# Patient Record
Sex: Male | Born: 1990 | Race: White | Hispanic: No | Marital: Single | Smoking: Former smoker
Health system: Southern US, Community
[De-identification: ages and names within clinical notes are randomized; demographics above are authoritative.]

---
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
## 2012-05-01 ENCOUNTER — Emergency Department: Payer: Self-pay | Admitting: Emergency Medicine

## 2012-05-01 LAB — COMPREHENSIVE METABOLIC PANEL
Alkaline Phosphatase: 71 U/L (ref 50–136)
Anion Gap: 12 (ref 7–16)
BUN: 14 mg/dL (ref 7–18)
Bilirubin,Total: 1.2 mg/dL — ABNORMAL HIGH (ref 0.2–1.0)
Chloride: 109 mmol/L — ABNORMAL HIGH (ref 98–107)
Co2: 22 mmol/L (ref 21–32)
Creatinine: 0.7 mg/dL (ref 0.60–1.30)
EGFR (African American): >60
EGFR (Non-African Amer.): >60
Glucose: 117 mg/dL — ABNORMAL HIGH (ref 65–99)
Potassium: 3.9 mmol/L (ref 3.5–5.1)
SGOT(AST): 25 U/L (ref 15–37)
SGPT (ALT): 26 U/L
Total Protein: 6.5 g/dL (ref 6.4–8.2)

## 2012-05-01 LAB — URINALYSIS, COMPLETE
Blood: NEGATIVE
Glucose,UR: NEGATIVE mg/dL (ref 0–75)
Specific Gravity: 1.017 (ref 1.003–1.030)
Squamous Epithelial: NONE SEEN

## 2012-05-01 LAB — DRUG SCREEN, URINE
Benzodiazepine, Ur Scrn: POSITIVE (ref ?–200)
Cannabinoid 50 Ng, Ur ~~LOC~~: POSITIVE (ref ?–50)
Cocaine Metabolite,Ur ~~LOC~~: NEGATIVE (ref ?–300)
MDMA (Ecstasy)Ur Screen: NEGATIVE (ref ?–500)
Methadone, Ur Screen: NEGATIVE (ref ?–300)
Phencyclidine (PCP) Ur S: NEGATIVE (ref ?–25)
Tricyclic, Ur Screen: NEGATIVE (ref ?–1000)

## 2012-05-01 LAB — CBC
HCT: 51.6 % (ref 40.0–52.0)
HGB: 17.6 g/dL (ref 13.0–18.0)
MCH: 34.1 pg — ABNORMAL HIGH (ref 26.0–34.0)
MCHC: 34.2 g/dL (ref 32.0–36.0)
Platelet: 207 10*3/uL (ref 150–440)
RDW: 13.4 % (ref 11.5–14.5)

## 2013-05-30 ENCOUNTER — Emergency Department: Payer: Self-pay | Admitting: Emergency Medicine

## 2013-05-30 LAB — URINALYSIS, COMPLETE
Bacteria: NONE SEEN
Blood: NEGATIVE
Glucose,UR: NEGATIVE mg/dL (ref 0–75)
Ketone: NEGATIVE
Nitrite: NEGATIVE
Protein: NEGATIVE
RBC,UR: 1 /HPF (ref 0–5)
Specific Gravity: 1.017 (ref 1.003–1.030)

## 2013-05-30 LAB — CBC WITH DIFFERENTIAL/PLATELET
Basophil #: 0 10*3/uL (ref 0.0–0.1)
HCT: 45.8 % (ref 40.0–52.0)
MCH: 32.8 pg (ref 26.0–34.0)
MCV: 96 fL (ref 80–100)
Monocyte #: 0.4 x10 3/mm (ref 0.2–1.0)
Monocyte %: 7 %
Neutrophil #: 3.6 10*3/uL (ref 1.4–6.5)
Platelet: 186 10*3/uL (ref 150–440)
WBC: 5.8 10*3/uL (ref 3.8–10.6)

## 2013-05-30 LAB — COMPREHENSIVE METABOLIC PANEL
Anion Gap: 5 — ABNORMAL LOW (ref 7–16)
BUN: 16 mg/dL (ref 7–18)
Chloride: 104 mmol/L (ref 98–107)
Creatinine: 0.95 mg/dL (ref 0.60–1.30)
EGFR (African American): >60
EGFR (Non-African Amer.): >60
Glucose: 91 mg/dL (ref 65–99)
Osmolality: 278 (ref 275–301)
Potassium: 3.9 mmol/L (ref 3.5–5.1)
SGOT(AST): 20 U/L (ref 15–37)
SGPT (ALT): 25 U/L (ref 12–78)
Sodium: 139 mmol/L (ref 136–145)

## 2013-05-30 LAB — DRUG SCREEN, URINE
Amphetamines, Ur Screen: POSITIVE (ref ?–1000)
Barbiturates, Ur Screen: NEGATIVE (ref ?–200)
Benzodiazepine, Ur Scrn: NEGATIVE (ref ?–200)
MDMA (Ecstasy)Ur Screen: NEGATIVE (ref ?–500)
Methadone, Ur Screen: POSITIVE (ref ?–300)

## 2017-07-27 ENCOUNTER — Encounter: Payer: Self-pay | Admitting: Emergency Medicine

## 2017-07-27 ENCOUNTER — Emergency Department
Admission: EM | Admit: 2017-07-27 | Discharge: 2017-07-27 | Disposition: A | Discharge status: Home / Self Care | Payer: Self-pay | Attending: Emergency Medicine | Admitting: Emergency Medicine

## 2017-07-27 DIAGNOSIS — L03021 Acute lymphangitis of right finger: Secondary | ICD-10-CM | POA: Insufficient documentation

## 2017-07-27 DIAGNOSIS — I891 Lymphangitis: Secondary | ICD-10-CM

## 2017-07-27 DIAGNOSIS — Z87891 Personal history of nicotine dependence: Secondary | ICD-10-CM | POA: Insufficient documentation

## 2017-07-27 DIAGNOSIS — L03011 Cellulitis of right finger: Secondary | ICD-10-CM | POA: Insufficient documentation

## 2017-07-27 LAB — COMPREHENSIVE METABOLIC PANEL
ALK PHOS: 67 U/L (ref 38–126)
ALT: 36 U/L (ref 17–63)
AST: 28 U/L (ref 15–41)
Albumin: 4.3 g/dL (ref 3.5–5.0)
Anion gap: 8 (ref 5–15)
BUN: 19 mg/dL (ref 6–20)
CALCIUM: 9.1 mg/dL (ref 8.9–10.3)
CO2: 27 mmol/L (ref 22–32)
CREATININE: 0.81 mg/dL (ref 0.61–1.24)
Chloride: 101 mmol/L (ref 101–111)
GFR calc non Af Amer: >60 mL/min (ref >60)
GLUCOSE: 94 mg/dL (ref 65–99)
Potassium: 4.3 mmol/L (ref 3.5–5.1)
SODIUM: 136 mmol/L (ref 135–145)
Total Bilirubin: 0.7 mg/dL (ref 0.3–1.2)
Total Protein: 7.2 g/dL (ref 6.5–8.1)

## 2017-07-27 LAB — CBC WITH DIFFERENTIAL/PLATELET
BASOS ABS: 0 10*3/uL (ref 0–0.1)
Basophils Relative: 0 %
EOS ABS: 0.1 10*3/uL (ref 0–0.7)
Eosinophils Relative: 1 %
HCT: 44.7 % (ref 40.0–52.0)
HEMOGLOBIN: 15.4 g/dL (ref 13.0–18.0)
LYMPHS ABS: 1.2 10*3/uL (ref 1.0–3.6)
LYMPHS PCT: 14 %
MCH: 32.8 pg (ref 26.0–34.0)
MCHC: 34.4 g/dL (ref 32.0–36.0)
MCV: 95.2 fL (ref 80.0–100.0)
Monocytes Absolute: 0.7 10*3/uL (ref 0.2–1.0)
Monocytes Relative: 8 %
NEUTROS PCT: 77 %
Neutro Abs: 6.5 10*3/uL (ref 1.4–6.5)
Platelets: 166 10*3/uL (ref 150–440)
RBC: 4.69 MIL/uL (ref 4.40–5.90)
RDW: 13 % (ref 11.5–14.5)
WBC: 8.4 10*3/uL (ref 3.8–10.6)

## 2017-07-27 MED ORDER — CLINDAMYCIN HCL 300 MG PO CAPS: 300.0000 mg | ORAL_CAPSULE | Freq: Four times a day (QID) | ORAL | 0 refills | Status: AC

## 2017-07-27 MED ORDER — CLINDAMYCIN PHOSPHATE 300 MG/2ML IJ SOLN
INTRAMUSCULAR | Status: AC
  Filled 2017-07-27: qty 4

## 2017-07-27 MED ORDER — CLINDAMYCIN PHOSPHATE 600 MG/4ML IJ SOLN
600.0000 mg | Freq: Once | INTRAMUSCULAR | Status: AC
  Administered 2017-07-27: 600 mg via INTRAMUSCULAR

## 2017-07-27 NOTE — ED Triage Notes (Signed)
Burn to right thumb on Sunday.  Today noticed red streaking up arm.

## 2017-07-27 NOTE — ED Notes (Signed)
Pt had burn to right hand thumb on Sunday from oven. Blisters appeared, pt popped blister. Redness running all the way up to armpit. Throbbing pain. Pt alert and oriented X4, active, cooperative, pt in NAD. RR even and unlabored, color WNL.  2 ibuprofen taken at 7AM today.

## 2017-07-27 NOTE — Discharge Instructions (Signed)
Take medication as prescribed. Return to emergency department if symptoms worsen and follow-up with PCP as needed.   °

## 2017-07-27 NOTE — ED Provider Notes (Signed)
[facility] Emergency Department Provider Note   ____________________________________________   I have reviewed the triage vital signs and the nursing notes.   HISTORY  Chief Complaint Arm Problem    HPI Wayne Fisher is a 26 y.o. male presents to the emergency department with right thumb wound he sustained after burning his thumb on Sunday. Since the burn, he noted reddening and swelling developing localized around the thumb. Patient awoke this morning with fever, chills and sweating. He also noted redness streaking from the right thumb of to the axilla of his right upper extremity.  Patient denies headache, vision changes, chest pain, chest tightness, shortness of breath, abdominal pain, nausea and vomiting.  History reviewed. No pertinent past medical history.  There are no active problems to display for this patient.   History reviewed. No pertinent surgical history.  Prior to Admission medications   Medication Sig Start Date End Date Taking? Authorizing Provider  clindamycin (CLEOCIN) 300 MG capsule Take 1 capsule (300 mg total) by mouth 4 (four) times daily. 07/27/17 08/06/17  Little, Jordan Likes, PA-C    Allergies Patient has no known allergies.  No family history on file.  Social History Social History  Substance Use Topics  . Smoking status: Former Smoker    Types: Cigarettes  . Smokeless tobacco: Never Used  . Alcohol use Yes     Comment: occasionally    Review of Systems Constitutional: Negative for fever/chills Eyes: No visual changes. ENT:  Negative for sore throat and for difficulty swallowing Cardiovascular: Denies chest pain. Respiratory: Denies cough. Denies shortness of breath. Gastrointestinal: No abdominal pain.  No nausea, vomiting, diarrhea. Genitourinary: Negative for dysuria. Musculoskeletal: Negative for back pain. Skin: Negative for rash. Positive for right thumb pain, redness with redness streaking along the  right upper extremity to the axillary region. Neurological: Negative for headaches. ____________________________________________   PHYSICAL EXAM:  VITAL SIGNS: ED Triage Vitals  Enc Vitals Group     BP 07/27/17 1118 132/88     Pulse Rate 07/27/17 1118 68     Resp 07/27/17 1118 20     Temp 07/27/17 1118 98.3 F (36.8 C)     Temp Source 07/27/17 1118 Oral     SpO2 07/27/17 1118 99 %     Weight 07/27/17 1118 155 lb (70.3 kg)     Height 07/27/17 1118 5\' 9"  (1.753 m)     Head Circumference --      Peak Flow --      Pain Score 07/27/17 1129 7     Pain Loc --      Pain Edu? --      Excl. in GC? --     Constitutional: Alert and oriented. Well appearing and in no acute distress.  Eyes: Conjunctivae are normal. PERRL. EOMI  Head: Normocephalic and atraumatic. ENT:      Ears: Canals clear. TMs intact bilaterally.      Nose: No congestion/rhinnorhea.      Mouth/Throat: Mucous membranes are moist.  Neck:Supple. No thyromegaly. No stridor.  Cardiovascular: Normal rate, regular rhythm. Normal S1 and S2.  Good peripheral circulation. Respiratory: Normal respiratory effort without tachypnea or retractions. Lungs CTAB. No wheezes/rales/rhonchi.  Hematological/Lymphatic/Immunological: No cervical lymphadenopathy. Cardiovascular: Normal rate, regular rhythm. Normal distal pulses. Gastrointestinal: Bowel sounds 4 quadrants. Soft and nontender to palpation. Musculoskeletal: Nontender with normal range of motion in all extremities. Neurologic: Normal speech and language.  Skin:  Skin is warm, dry and intact. No rash noted.  Erythema, swelling around burns along right thumb with lymphangitis streaking to the right axilla region. Psychiatric: Mood and affect are normal. Speech and behavior are normal. Patient exhibits appropriate insight and judgement.  ____________________________________________   LABS (all labs ordered are listed, but only abnormal results are displayed)  Labs Reviewed    CBC WITH DIFFERENTIAL/PLATELET  COMPREHENSIVE METABOLIC PANEL   ____________________________________________  EKG none ____________________________________________  RADIOLOGY none ____________________________________________   PROCEDURES  Procedure(s) performed: no    Critical Care performed: no ____________________________________________   INITIAL IMPRESSION / ASSESSMENT AND PLAN / ED COURSE  Pertinent labs & imaging results that were available during my care of the patient were reviewed by me and considered in my medical decision making (see chart for details).  Patient presents to emergency department with right thumb pain, erythema and progressing redness streaking to the right axillary region.Marland Kitchen History, physical exam findings and labs are reassuring symptoms are consistent with cellulitis and lymphangitis that developed as result of right thumb burn wounds. Antibodies initiated in the emergency department, clindamycin 600 mg IM. Patient will be prescribed clindamycin for interbody coverage. Patient advised to follow up with PCP as needed or return to the emergency department if symptoms return or worsen. Patient informed of clinical course, understand medical decision-making process, and agree with plan. ____________________________________________   FINAL CLINICAL IMPRESSION(S) / ED DIAGNOSES  Final diagnoses:  Cellulitis of right thumb  Lymphangitis       NEW MEDICATIONS STARTED DURING THIS VISIT:  Discharge Medication List as of 07/27/2017  2:22 PM    START taking these medications   Details  clindamycin (CLEOCIN) 300 MG capsule Take 1 capsule (300 mg total) by mouth 4 (four) times daily., Starting Tue 07/27/2017, Until Fri 08/06/2017, Print         Note:  This document was prepared using Dragon voice recognition software and may include unintentional dictation errors.    Clois Comber, PA-C 07/27/17 1439    Emily Filbert, MD 07/27/17  [phone]

## 2018-05-13 ENCOUNTER — Emergency Department
Admission: EM | Admit: 2018-05-13 | Discharge: 2018-05-13 | Disposition: A | Discharge status: Home / Self Care | Payer: Self-pay | Attending: Emergency Medicine | Admitting: Emergency Medicine

## 2018-05-13 ENCOUNTER — Other Ambulatory Visit: Payer: Self-pay

## 2018-05-13 DIAGNOSIS — L089 Local infection of the skin and subcutaneous tissue, unspecified: Secondary | ICD-10-CM | POA: Insufficient documentation

## 2018-05-13 DIAGNOSIS — T148XXA Other injury of unspecified body region, initial encounter: Secondary | ICD-10-CM

## 2018-05-13 DIAGNOSIS — Z87891 Personal history of nicotine dependence: Secondary | ICD-10-CM | POA: Insufficient documentation

## 2018-05-13 MED ORDER — CEPHALEXIN 500 MG PO CAPS: 500.0000 mg | ORAL_CAPSULE | Freq: Two times a day (BID) | ORAL | 0 refills | Status: DC

## 2018-05-13 MED ORDER — CEPHALEXIN 500 MG PO CAPS
500.0000 mg | ORAL_CAPSULE | Freq: Once | ORAL | Status: AC
  Administered 2018-05-13: 500 mg via ORAL
  Filled 2018-05-13: qty 1

## 2018-05-13 MED ORDER — LIDOCAINE-EPINEPHRINE-TETRACAINE (LET) SOLUTION
3.0000 mL | Freq: Once | NASAL | Status: AC
  Administered 2018-05-13: 3 mL via TOPICAL
  Filled 2018-05-13: qty 3

## 2018-05-13 MED ORDER — SULFAMETHOXAZOLE-TRIMETHOPRIM 800-160 MG PO TABS
1.0000 | ORAL_TABLET | Freq: Once | ORAL | Status: AC
  Administered 2018-05-13: 1 via ORAL
  Filled 2018-05-13: qty 1

## 2018-05-13 MED ORDER — SULFAMETHOXAZOLE-TRIMETHOPRIM 800-160 MG PO TABS: 1.0000 | ORAL_TABLET | Freq: Two times a day (BID) | ORAL | 0 refills | Status: DC

## 2018-05-13 NOTE — ED Notes (Signed)
Pt states he burned his right thumb while cooking on Tuesday and now has swelling and pain to the site..Marland Kitchen

## 2018-05-13 NOTE — ED Triage Notes (Signed)
Pt states that he burned his rt thumb Monday, now states that he has a red streak moving from the thumb up to the inside of his rt elbow. Pt denies fever at home

## 2018-05-13 NOTE — ED Provider Notes (Signed)
[facility] Emergency Department Provider Note   ____________________________________________    I have reviewed the triage vital signs and the nursing notes.   HISTORY  Chief Complaint Wound Infection     HPI Wayne Fisher is a 27 y.o. male who presents with complaints of swelling and pain to his right thumb.  Reports he burned his thumb on Monday and the next day he developed swelling over the burn as well as redness.  He felt that there was some fluid there and used a needle to try to drain it.  He presents today because of a streak of redness going up his arm from his thumb.  No fevers or chills.  No history of diabetes.  Reports a similar infection in the past which required antibiotic   No past medical history on file.  There are no active problems to display for this patient.   No past surgical history on file.  Prior to Admission medications   Medication Sig Start Date End Date Taking? Authorizing Provider  cephALEXin (KEFLEX) 500 MG capsule Take 1 capsule (500 mg total) by mouth 2 (two) times daily. 05/13/18   Jene Every, MD  sulfamethoxazole-trimethoprim (BACTRIM DS,SEPTRA DS) 800-160 MG tablet Take 1 tablet by mouth 2 (two) times daily. 05/13/18   Jene Every, MD     Allergies Patient has no known allergies.  No family history on file.  Social History Social History   Tobacco Use  . Smoking status: Former Smoker    Types: Cigarettes  . Smokeless tobacco: Never Used  Substance Use Topics  . Alcohol use: Yes    Comment: occasionally  . Drug use: Yes    Types: Marijuana    Review of Systems  Constitutional: No fever/chills Eyes: No blurry vision ENT: No sore throat. Cardiovascular: Denies palpitations Respiratory: Denies cough Gastrointestinal: No nausea, no vomiting.   Genitourinary: Negative for dysuria. Musculoskeletal: As above Skin: As above Neurological: Negative for  headaches   ____________________________________________   PHYSICAL EXAM:  VITAL SIGNS: ED Triage Vitals  Enc Vitals Group     BP 05/13/18 0851 125/70     Pulse Rate 05/13/18 0851 67     Resp 05/13/18 0851 18     Temp 05/13/18 0851 98 F (36.7 C)     Temp Source 05/13/18 0851 Oral     SpO2 05/13/18 0851 98 %     Weight 05/13/18 0852 68 kg (150 lb)     Height 05/13/18 0852 1.727 m (5\' 8" )     Head Circumference --      Peak Flow --      Pain Score 05/13/18 0851 6     Pain Loc --      Pain Edu? --      Excl. in GC? --     Constitutional: Alert and oriented. No acute distress. Pleasant and interactive  Nose: No congestion/rhinnorhea. Mouth/Throat: Mucous membranes are moist.    Cardiovascular: Normal rate, regular rhythm.   Good peripheral circulation. Respiratory: Normal respiratory effort.  No retractions.    Musculoskeletal: Lateral aspect of right distal thumb approximately 2 cm x 1 cm area of erythema with minimal fluctuance and some blistering with  erythema streaking down the thumb and up the arm, warm and well perfused Neurologic:  Normal speech and language. No gross focal neurologic deficits are appreciated.  Skin:  Skin is warm, dry.  As above   ____________________________________________   LABS (all labs ordered are listed,  but only abnormal results are displayed)  Labs Reviewed - No data to display ____________________________________________  EKG   ____________________________________________  RADIOLOGY   ____________________________________________   PROCEDURES  Procedure(s) performed: yes  .Marland Kitchen.Incision and Drainage Date/Time: 05/13/2018 9:36 AM Performed by: Jene EveryKinner, Kazimir Hartnett, MD Authorized by: Jene EveryKinner, Finola Rosal, MD   Consent:    Consent obtained:  Verbal   Consent given by:  Patient   Risks discussed:  Bleeding, infection, incomplete drainage and pain   Alternatives discussed:  Alternative treatment, delayed treatment and  observation Location:    Type:  Abscess   Location:  Upper extremity   Upper extremity location:  Hand   Hand location:  R hand Pre-procedure details:    Skin preparation:  Antiseptic wash Anesthesia (see MAR for exact dosages):    Anesthesia method:  Topical application Procedure type:    Complexity:  Simple Procedure details:    Incision types:  Single straight   Incision depth:  Dermal   Scalpel blade:  11   Drainage:  Purulent   Drainage amount:  Scant   Wound treatment:  Wound left open   Packing materials:  None Post-procedure details:    Patient tolerance of procedure:  Tolerated well, no immediate complications     Critical Care performed: No ____________________________________________   INITIAL IMPRESSION / ASSESSMENT AND PLAN / ED COURSE  Pertinent labs & imaging results that were available during my care of the patient were reviewed by me and considered in my medical decision making (see chart for details).  Mild fluctuance with suspicion of some purulence trapped beneath, let applied, small incision made.  We will treat the patient with Bactrim and Keflex and close outpatient follow-up    ____________________________________________   FINAL CLINICAL IMPRESSION(S) / ED DIAGNOSES  Final diagnoses:  Wound infection        Note:  This document was prepared using Dragon voice recognition software and may include unintentional dictation errors.    Jene EveryKinner, Zael Shuman, MD 05/13/18 [phone]

## 2018-12-10 ENCOUNTER — Other Ambulatory Visit: Payer: Self-pay

## 2018-12-10 ENCOUNTER — Emergency Department
Admission: EM | Admit: 2018-12-10 | Discharge: 2018-12-11 | Disposition: A | Discharge status: Home / Self Care | Payer: Self-pay | Attending: Emergency Medicine | Admitting: Emergency Medicine

## 2018-12-10 ENCOUNTER — Emergency Department: Payer: Self-pay

## 2018-12-10 ENCOUNTER — Encounter: Payer: Self-pay | Admitting: Emergency Medicine

## 2018-12-10 DIAGNOSIS — R0602 Shortness of breath: Secondary | ICD-10-CM | POA: Insufficient documentation

## 2018-12-10 DIAGNOSIS — Z79899 Other long term (current) drug therapy: Secondary | ICD-10-CM | POA: Insufficient documentation

## 2018-12-10 DIAGNOSIS — Z87891 Personal history of nicotine dependence: Secondary | ICD-10-CM | POA: Insufficient documentation

## 2018-12-10 LAB — BASIC METABOLIC PANEL
Anion gap: 12 (ref 5–15)
BUN: 15 mg/dL (ref 6–20)
CO2: 23 mmol/L (ref 22–32)
Calcium: 9.7 mg/dL (ref 8.9–10.3)
Chloride: 104 mmol/L (ref 98–111)
Creatinine, Ser: 1.11 mg/dL (ref 0.61–1.24)
GFR calc Af Amer: >60 mL/min (ref >60)
GFR calc non Af Amer: >60 mL/min (ref >60)
Glucose, Bld: 121 mg/dL — ABNORMAL HIGH (ref 70–99)
Potassium: 3.4 mmol/L — ABNORMAL LOW (ref 3.5–5.1)
Sodium: 139 mmol/L (ref 135–145)

## 2018-12-10 LAB — CBC
HEMATOCRIT: 44.6 % (ref 39.0–52.0)
Hemoglobin: 15.7 g/dL (ref 13.0–17.0)
MCH: 32.5 pg (ref 26.0–34.0)
MCHC: 35.2 g/dL (ref 30.0–36.0)
MCV: 92.3 fL (ref 80.0–100.0)
PLATELETS: 202 10*3/uL (ref 150–400)
RBC: 4.83 MIL/uL (ref 4.22–5.81)
RDW: 11.7 % (ref 11.5–15.5)
WBC: 6 10*3/uL (ref 4.0–10.5)
nRBC: 0 % (ref 0.0–0.2)

## 2018-12-10 LAB — TROPONIN I: Troponin I: <0.03 ng/mL (ref <0.03)

## 2018-12-10 NOTE — ED Triage Notes (Addendum)
C/O SOB intermittently x 2-3 months.  While at work today had another episode and finger and hand felt stiff.  Patient states he feels anxious after episodes of SOB begin.  Also states that he has had sinus drainage, that accompanies sensation of SOB.  States throat can be sore intermittently and feeling like he is being choked.  Patient is AAOx3.  Skin warm and dry. NAD.Marland Kitchen  Voice clear and strong. Patient is very anxious in triage.

## 2018-12-11 MED ORDER — PSEUDOEPHEDRINE HCL ER 120 MG PO TB12: 120.0000 mg | ORAL_TABLET | Freq: Two times a day (BID) | ORAL | 0 refills | Status: AC

## 2018-12-11 MED ORDER — FLUTICASONE PROPIONATE 50 MCG/ACT NA SUSP: 1.0000 | Freq: Every day | NASAL | 0 refills | Status: AC

## 2018-12-11 NOTE — ED Provider Notes (Signed)
[facility] Emergency Department Provider Note ____________________________________________   First MD Initiated Contact with Patient 12/10/18 2357     (approximate)  I have reviewed the triage vital signs and the nursing notes.   HISTORY  Chief Complaint Shortness of Breath    HPI Wayne Fisher is a 28 y.o. male with no significant PMH who presents with intermittent episodes of shortness of breath over the last month, sometimes lasting a few minutes, or lasting up to an hour at times.  He states he had an episode today while he was at work and and subsequently felt stiffness in his fingers and hands bilaterally as well as some tingling.  He has never had this happen before.  He also reports that over the last few weeks he has had postnasal drip and sinus pressure with occasional tightness in his throat.  The patient does endorse anxiety although he denies any specific life stressors.  He states he is on Suboxone and is trying to lower the dose.  He states he occasionally uses marijuana but denies other illicit drug use and denies significant alcohol use.   History reviewed. No pertinent past medical history.  There are no active problems to display for this patient.   History reviewed. No pertinent surgical history.  Prior to Admission medications   Medication Sig Start Date End Date Taking? Authorizing Provider  cephALEXin (KEFLEX) 500 MG capsule Take 1 capsule (500 mg total) by mouth 2 (two) times daily. 05/13/18   Jene EveryKinner, Robert, MD  fluticasone (FLONASE) 50 MCG/ACT nasal spray Place 1 spray into both nostrils daily for 15 days. 12/11/18 12/26/18  Dionne BucySiadecki, Marianny Goris, MD  pseudoephedrine (SUDAFED) 120 MG 12 hr tablet Take 1 tablet (120 mg total) by mouth 2 (two) times daily for 15 days. 12/11/18 12/26/18  Dionne BucySiadecki, Quinetta Shilling, MD  sulfamethoxazole-trimethoprim (BACTRIM DS,SEPTRA DS) 800-160 MG tablet Take 1 tablet by mouth 2 (two) times daily. 05/13/18    Jene EveryKinner, Robert, MD    Allergies Patient has no known allergies.  No family history on file.  Social History Social History   Tobacco Use  . Smoking status: Former Smoker    Types: Cigarettes  . Smokeless tobacco: Never Used  Substance Use Topics  . Alcohol use: Yes    Comment: occasionally  . Drug use: Yes    Types: Marijuana    Review of Systems  Constitutional: No fever. Eyes: No redness. ENT: Positive for intermittent throat pain. Cardiovascular: Denies chest pain. Respiratory: Positive for intermittent shortness of breath. Gastrointestinal: No vomiting or diarrhea.  Genitourinary: Negative for flank pain.  Musculoskeletal: Negative for back pain. Skin: Negative for rash. Neurological: Negative for headache.  ____________________________________________   PHYSICAL EXAM:  VITAL SIGNS: ED Triage Vitals  Enc Vitals Group     BP 12/10/18 1657 135/76     Pulse Rate 12/10/18 1657 94     Resp 12/10/18 1657 16     Temp 12/10/18 1657 98.2 F (36.8 C)     Temp Source 12/10/18 1657 Oral     SpO2 12/10/18 1657 97 %     Weight 12/10/18 1656 155 lb (70.3 kg)     Height 12/10/18 1656 5\' 10"  (1.778 m)     Head Circumference --      Peak Flow --      Pain Score 12/10/18 1654 3     Pain Loc --      Pain Edu? --      Excl. in GC? --  Constitutional: Alert and oriented.  Anxious appearing but in no acute distress. Eyes: Conjunctivae are normal.  Head: Atraumatic. Nose: No congestion/rhinnorhea. Mouth/Throat: Mucous membranes are moist.   Neck: Normal range of motion.  Cardiovascular: Normal rate, regular rhythm. Grossly normal heart sounds.  Good peripheral circulation. Respiratory: Normal respiratory effort.  No retractions. Lungs CTAB. Gastrointestinal: No distention.  Musculoskeletal: No lower extremity edema.  No calf or popliteal swelling or tenderness.  Extremities warm and well perfused.  Neurologic:  Normal speech and language. No gross focal neurologic  deficits are appreciated.  Skin:  Skin is warm and dry. No rash noted. Psychiatric: Mood and affect are normal. Speech and behavior are normal.  ____________________________________________   LABS (all labs ordered are listed, but only abnormal results are displayed)  Labs Reviewed  BASIC METABOLIC PANEL - Abnormal; Notable for the following components:      Result Value   Potassium 3.4 (*)    Glucose, Bld 121 (*)    All other components within normal limits  CBC  TROPONIN I   ____________________________________________  EKG  ED ECG REPORT I, Dionne Bucy, the attending physician, personally viewed and interpreted this ECG.  Date: 12/11/2018 EKG Time: 1701 Rate: 101 Rhythm: normal sinus rhythm QRS Axis: normal Intervals: normal ST/T Wave abnormalities: normal Narrative Interpretation: no evidence of acute ischemia  ____________________________________________  RADIOLOGY  CXR: No focal infiltrate or other acute abnormality  ____________________________________________   PROCEDURES  Procedure(s) performed: No  Procedures  Critical Care performed: No ____________________________________________   INITIAL IMPRESSION / ASSESSMENT AND PLAN / ED COURSE  Pertinent labs & imaging results that were available during my care of the patient were reviewed by me and considered in my medical decision making (see chart for details).  28 year old male with no significant past medical history presents with intermittent episodes of shortness of breath usually lasting a short time, and occurring over the last month.  He does endorse anxiety accompanying them.  Today he had symptoms of cramping and tightness in bilateral hands after 1 of these episodes.  He also reports some sinus pressure and postnasal drip.  On exam the patient is well-appearing.  His vital signs are normal by the time of my exam although he was slightly tachycardic at triage.  The remainder of the exam  is unremarkable except that he appears slightly anxious.  Work-up obtained from triage is reassuring.  The lab work-up is unremarkable.  Chest x-ray is clear.  EKG shows no significant abnormalities.  Overall presentation is consistent with anxiety.  Today's episode is consistent with symptoms from hyperventilation and when I described this to the patient he agreed that this was the likely etiology.  There is no evidence of cardiac etiology, PE, or other acute organic cause given the description of the symptoms and their intermittent nature.  The patient was reassured.  I will prescribe Flonase and Sudafed for his postnasal drip and sinus symptoms as these seem to be somewhat exacerbating his anxiety episodes.  The patient states that he just got insurance and is planning to follow-up with a primary care doctor as soon as possible.  I do not think he needs any acute psychiatric referral but should see a primary care doctor first.  The patient is stable for discharge.  Return precautions given, and he expresses understanding. ____________________________________________   FINAL CLINICAL IMPRESSION(S) / ED DIAGNOSES  Final diagnoses:  Shortness of breath      NEW MEDICATIONS STARTED DURING THIS VISIT:  New Prescriptions  FLUTICASONE (FLONASE) 50 MCG/ACT NASAL SPRAY    Place 1 spray into both nostrils daily for 15 days.   PSEUDOEPHEDRINE (SUDAFED) 120 MG 12 HR TABLET    Take 1 tablet (120 mg total) by mouth 2 (two) times daily for 15 days.     Note:  This document was prepared using Dragon voice recognition software and may include unintentional dictation errors.    Dionne Bucy, MD 12/11/18 Rich Fuchs

## 2018-12-11 NOTE — Discharge Instructions (Signed)
You can take the prescribed medications as needed for your sinus pressure and postnasal drip.  Make an appointment to follow-up with your primary care doctor as discussed.  Return to the ER for new, worsening, or persistent shortness of breath, shortness of breath that does not get better after a short time, chest pain, lightheadedness or passing out, fevers, weakness, or any other new or worsening symptoms that concern you.

## 2020-12-26 ENCOUNTER — Other Ambulatory Visit: Payer: Self-pay

## 2020-12-26 ENCOUNTER — Emergency Department: Payer: No Typology Code available for payment source

## 2020-12-26 ENCOUNTER — Emergency Department
Admission: EM | Admit: 2020-12-26 | Discharge: 2020-12-26 | Disposition: A | Discharge status: Home / Self Care | Payer: No Typology Code available for payment source | Attending: Student in an Organized Health Care Education/Training Program | Admitting: Student in an Organized Health Care Education/Training Program

## 2020-12-26 DIAGNOSIS — Z87891 Personal history of nicotine dependence: Secondary | ICD-10-CM | POA: Insufficient documentation

## 2020-12-26 DIAGNOSIS — Z23 Encounter for immunization: Secondary | ICD-10-CM | POA: Insufficient documentation

## 2020-12-26 DIAGNOSIS — L03113 Cellulitis of right upper limb: Secondary | ICD-10-CM | POA: Insufficient documentation

## 2020-12-26 LAB — CBC
HCT: 43.9 % (ref 39.0–52.0)
Hemoglobin: 14.8 g/dL (ref 13.0–17.0)
MCH: 32 pg (ref 26.0–34.0)
MCHC: 33.7 g/dL (ref 30.0–36.0)
MCV: 94.8 fL (ref 80.0–100.0)
Platelets: 221 10*3/uL (ref 150–400)
RBC: 4.63 MIL/uL (ref 4.22–5.81)
RDW: 12.6 % (ref 11.5–15.5)
WBC: 4.9 10*3/uL (ref 4.0–10.5)
nRBC: 0 % (ref 0.0–0.2)

## 2020-12-26 LAB — BASIC METABOLIC PANEL
Anion gap: 14 (ref 5–15)
BUN: 23 mg/dL — ABNORMAL HIGH (ref 6–20)
CO2: 26 mmol/L (ref 22–32)
Calcium: 9.8 mg/dL (ref 8.9–10.3)
Chloride: 100 mmol/L (ref 98–111)
Creatinine, Ser: 0.86 mg/dL (ref 0.61–1.24)
GFR, Estimated: >60 mL/min (ref >60)
Glucose, Bld: 89 mg/dL (ref 70–99)
Potassium: 3.7 mmol/L (ref 3.5–5.1)
Sodium: 140 mmol/L (ref 135–145)

## 2020-12-26 MED ORDER — LIDOCAINE HCL (PF) 1 % IJ SOLN
2.0000 mL | Freq: Once | INTRAMUSCULAR | Status: AC
  Administered 2020-12-26: 2 mL
  Filled 2020-12-26: qty 5

## 2020-12-26 MED ORDER — CEPHALEXIN 500 MG PO CAPS: 500.0000 mg | ORAL_CAPSULE | Freq: Four times a day (QID) | ORAL | 0 refills | Status: AC

## 2020-12-26 MED ORDER — TETANUS-DIPHTH-ACELL PERTUSSIS 5-2.5-18.5 LF-MCG/0.5 IM SUSY
0.5000 mL | PREFILLED_SYRINGE | Freq: Once | INTRAMUSCULAR | Status: AC
  Administered 2020-12-26: 0.5 mL via INTRAMUSCULAR
  Filled 2020-12-26: qty 0.5

## 2020-12-26 MED ORDER — CEFTRIAXONE SODIUM 1 G IJ SOLR
1.0000 g | Freq: Once | INTRAMUSCULAR | Status: AC
  Administered 2020-12-26: 1 g via INTRAMUSCULAR
  Filled 2020-12-26: qty 10

## 2020-12-26 NOTE — ED Provider Notes (Signed)
[facility] Emergency Department Provider Note  ____________________________________________   Event Date/Time   First MD Initiated Contact with Patient 12/26/20 1650     (approximate)  I have reviewed the triage vital signs and the nursing notes.   HISTORY  Chief Complaint Hand Burn    HPI Wayne Fisher is a 30 y.o. male presents emergency department stating he had a burn to the right thumb on Saturday and now has streaking redness going up the wrist into the forearm.  Patient denies any fever or chills.  Denies any IV drug use.  Tdap is not utd   No past medical history on file.  There are no problems to display for this patient.   No past surgical history on file.  Prior to Admission medications   Medication Sig Start Date End Date Taking? Authorizing Provider  cephALEXin (KEFLEX) 500 MG capsule Take 1 capsule (500 mg total) by mouth 4 (four) times daily for 7 days. 12/26/20 01/02/21 Yes Wilford Merryfield, Roselyn Bering, PA-C  fluticasone (FLONASE) 50 MCG/ACT nasal spray Place 1 spray into both nostrils daily for 15 days. 12/11/18 12/26/18  Dionne Bucy, MD    Allergies Patient has no known allergies.  No family history on file.  Social History Social History   Tobacco Use  . Smoking status: Former Smoker    Types: Cigarettes  . Smokeless tobacco: Never Used  Substance Use Topics  . Alcohol use: Yes    Comment: occasionally  . Drug use: Yes    Types: Marijuana    Review of Systems  Constitutional: No fever/chills Eyes: No visual changes. ENT: No sore throat. Respiratory: Denies cough Cardiovascular: Denies chest pain Gastrointestinal: Denies abdominal pain Genitourinary: Negative for dysuria. Musculoskeletal: Negative for back pain. Skin: Positive redness at the right thumb and wrist Psychiatric: no mood changes,     ____________________________________________   PHYSICAL EXAM:  VITAL SIGNS: ED Triage Vitals  Enc Vitals  Group     BP 12/26/20 1559 125/78     Pulse Rate 12/26/20 1559 79     Resp 12/26/20 1559 18     Temp 12/26/20 1559 99.1 F (37.3 C)     Temp Source 12/26/20 1559 Oral     SpO2 12/26/20 1559 98 %     Weight 12/26/20 1600 155 lb (70.3 kg)     Height 12/26/20 1600 5\' 9"  (1.753 m)     Head Circumference --      Peak Flow --      Pain Score 12/26/20 1600 6     Pain Loc --      Pain Edu? --      Excl. in GC? --     Constitutional: Alert and oriented. Well appearing and in no acute distress. Eyes: Conjunctivae are normal.  Head: Atraumatic. Nose: No congestion/rhinnorhea. Mouth/Throat: Mucous membranes are moist.  Neck:  supple no lymphadenopathy noted Cardiovascular: Normal rate, regular rhythm. Respiratory: Normal respiratory effort.  No retractions,  GU: deferred Musculoskeletal: FROM all extremities, warm and well perfused, full range of motion of the right thumb, burn marks noted on the volar surface of the thumb, red streak noted that leads from the thumb up to the wrist and slightly into the forearm, no lymphadenopathy is noted in the axilla or at the elbow, neurovascular is intact Neurologic:  Normal speech and language.  Skin:  Skin is warm, dry and intact. No rash noted. Psychiatric: Mood and affect are normal. Speech and behavior are normal.  ____________________________________________   LABS (all labs ordered are listed, but only abnormal results are displayed)  Labs Reviewed  BASIC METABOLIC PANEL - Abnormal; Notable for the following components:      Result Value   BUN 23 (*)    All other components within normal limits  CBC   ____________________________________________   ____________________________________________  RADIOLOGY  X-ray of the right hand  ____________________________________________   PROCEDURES  Procedure(s) performed: Rocephin 1 g IM, Tdap updated   Procedures    ____________________________________________   INITIAL  IMPRESSION / ASSESSMENT AND PLAN / ED COURSE  Pertinent labs & imaging results that were available during my care of the patient were reviewed by me and considered in my medical decision making (see chart for details).   Patient is a 30 year old male presents with infection to the right thumb.  See HPI.  Physical exam shows there to be cellulitis with a streak leading up the right arm.  CBC and metabolic panel are normal X-ray of the right hand is negative  I feel that the patient would benefit from a shot of Rocephin, we gave 1 g IM, Tdap was updated.  He was placed on Keflex 500 4 times daily for 7 days.  He is to follow-up with orthopedics if not improving to 3 days.  Return emergency department worsening.  States he understands.  Is discharged stable condition.     Wayne Fisher was evaluated in Emergency Department on 12/26/2020 for the symptoms described in the history of present illness. He was evaluated in the context of the global COVID-19 pandemic, which necessitated consideration that the patient might be at risk for infection with the SARS-CoV-2 virus that causes COVID-19. Institutional protocols and algorithms that pertain to the evaluation of patients at risk for COVID-19 are in a state of rapid change based on information released by regulatory bodies including the CDC and federal and state organizations. These policies and algorithms were followed during the patient's care in the ED.    As part of my medical decision making, I reviewed the following data within the electronic MEDICAL RECORD NUMBER Nursing notes reviewed and incorporated, Labs reviewed , Old chart reviewed, Radiograph reviewed , Notes from prior ED visits and [place] Controlled Substance Database  ____________________________________________   FINAL CLINICAL IMPRESSION(S) / ED DIAGNOSES  Final diagnoses:  Cellulitis of right upper extremity      NEW MEDICATIONS STARTED DURING THIS VISIT:  New Prescriptions    CEPHALEXIN (KEFLEX) 500 MG CAPSULE    Take 1 capsule (500 mg total) by mouth 4 (four) times daily for 7 days.     Note:  This document was prepared using Dragon voice recognition software and may include unintentional dictation errors.    Faythe Ghee, PA-C 12/26/20 1712    Arnaldo Natal, MD 12/26/20 2040

## 2020-12-26 NOTE — Discharge Instructions (Addendum)
Follow-up with orthopedics.  Take the medication as prescribed.  Return to the emergency department worsening.  Follow-up with orthopedics if not improving in 3 days.

## 2020-12-26 NOTE — ED Provider Notes (Signed)
MSE was initiated and I personally evaluated the patient and placed orders (if any) at  4:04 PM on December 26, 2020.  Burn to thumb on Saturday, streaking/rednes to medial right wrist occurred today. Labs sent, xrays obtained.   The patient appears stable so that the remainder of the MSE may be completed by another provider.   Lucy Chris, PA 12/26/20 1604    Chesley Noon, MD 12/26/20 [phone]

## 2020-12-26 NOTE — ED Triage Notes (Signed)
Pt here with a burn to his right hand that occurred a few days ago. Pt noticed that his hand was turning more red with times and traveling up his arm. Pt's right arm slightly red. Pt in NAD in triage.

## 2022-05-04 ENCOUNTER — Ambulatory Visit
Admission: EM | Admit: 2022-05-04 | Discharge: 2022-05-04 | Disposition: A | Discharge status: Home / Self Care | Payer: Self-pay | Attending: Emergency Medicine | Admitting: Emergency Medicine

## 2022-05-04 ENCOUNTER — Encounter: Payer: Self-pay | Admitting: Emergency Medicine

## 2022-05-04 DIAGNOSIS — R42 Dizziness and giddiness: Secondary | ICD-10-CM

## 2022-05-04 LAB — POCT FASTING CBG KUC MANUAL ENTRY: POCT Glucose (KUC): 116 mg/dL — AB (ref 70–99)

## 2022-05-04 NOTE — Discharge Instructions (Addendum)
The cause of your dizziness is unknown at this time  Your blood sugar level in office today was 116 which is not elevated for someone who is eaten today  We have obtained lab work to check your electrolytes, liver, kidney, blood levels, thyroid and diabetes levels, you will be notified of any concerning values and told how to move forward  In the meantime ensure that you are getting adequate fluid intake, it is recommended that she drink at least eight glasses of water a day  Ensure that you are getting adequate rest, attempt to get at least 8 hours of sleep at night  Change positions slowly when symptoms are present waiting at least 10 seconds between movements to help stabilize her body  Keep a snack close by if this seems to help your symptoms, ideally peanut butter crackers are the best option due to the amount of protein  A primary care referral has been placed for you, if your symptoms continue to persist she will need follow-up and further evaluation

## 2022-05-04 NOTE — ED Triage Notes (Addendum)
Pt presents with dizziness and fatigue off and on x 4 days. Pt is worried about diabetes. Pt denies having a PCP.

## 2022-05-04 NOTE — ED Provider Notes (Signed)
Roderic Palau    CSN: [id number] Arrival date & time: 05/04/22  1350      History   Chief Complaint Chief Complaint  Patient presents with   Dizziness   Weakness    HPI Wayne Fisher is a 31 y.o. male.   Patient presents with concerns regarding dizziness.  Endorses 2 episodes occurring over the last 4 days.  Initial episode was 4 days ago, started abruptly when he was changing from a sitting to lying position.  Dizziness felt as if the room was spinning and he was off balance with associated fogginess and tunnel vision.  Symptoms resolving in less than 5 minutes.  I attempted to eat a snack after which he feels improved symptoms.  Endorses generalized weakness and fatigue lasting 2 days after symptoms before resolution.  Last episode occurring 2 days ago with similar presentation.  Concern for possible diabetes due to strong family history.  Does not have PCP.  History of opioid addiction, currently taking Suboxone, endorses that he is weaning himself off the medication, and has been doing so over the last few months.  Denies loss of consciousness, syncope, lightheadedness, memory or speech changes, shortness of breath, chest pain or tightness.  History reviewed. No pertinent past medical history.  There are no problems to display for this patient.   History reviewed. No pertinent surgical history.     Home Medications    Prior to Admission medications   Medication Sig Start Date End Date Taking? Authorizing Provider  fluticasone (FLONASE) 50 MCG/ACT nasal spray Place 1 spray into both nostrils daily for 15 days. 12/11/18 12/26/18  Arta Silence, MD    Family History No family history on file.  Social History Social History   Tobacco Use   Smoking status: Former    Types: Cigarettes   Smokeless tobacco: Never  Vaping Use   Vaping Use: Never used  Substance Use Topics   Alcohol use: Yes    Comment: occasionally   Drug use: Yes    Types: Marijuana      Allergies   Patient has no known allergies.   Review of Systems Review of Systems  Constitutional: Negative.   Respiratory: Negative.    Cardiovascular: Negative.   Skin: Negative.   Neurological:  Positive for dizziness and weakness. Negative for tremors, seizures, syncope, facial asymmetry, speech difficulty, light-headedness, numbness and headaches.    Physical Exam Triage Vital Signs ED Triage Vitals  Enc Vitals Group     BP 05/04/22 1414 129/82     Pulse Rate 05/04/22 1414 61     Resp 05/04/22 1414 16     Temp 05/04/22 1414 98.6 F (37 C)     Temp Source 05/04/22 1414 Oral     SpO2 05/04/22 1414 97 %     Weight --      Height --      Head Circumference --      Peak Flow --      Pain Score 05/04/22 1417 0     Pain Loc --      Pain Edu? --      Excl. in [place]? --    No data found.  Updated Vital Signs BP 129/82 (BP Location: Left Arm)   Pulse 61   Temp 98.6 F (37 C) (Oral)   Resp 16   SpO2 97%   Visual Acuity Right Eye Distance:   Left Eye Distance:   Bilateral Distance:    Right Eye Near:  Left Eye Near:    Bilateral Near:     Physical Exam Constitutional:      Appearance: Normal appearance.  HENT:     Head: Normocephalic.  Eyes:     Extraocular Movements: Extraocular movements intact.     Conjunctiva/sclera: Conjunctivae normal.     Pupils: Pupils are equal, round, and reactive to light.  Cardiovascular:     Rate and Rhythm: Normal rate and regular rhythm.     Pulses: Normal pulses.     Heart sounds: Normal heart sounds.  Pulmonary:     Effort: Pulmonary effort is normal.     Breath sounds: Normal breath sounds.  Neurological:     General: No focal deficit present.     Mental Status: He is alert and oriented to person, place, and time. Mental status is at baseline.     Cranial Nerves: No cranial nerve deficit.     Sensory: No sensory deficit.     Motor: No weakness.     Gait: Gait normal.  Psychiatric:        Mood and Affect:  Mood normal.        Behavior: Behavior normal.     UC Treatments / Results  Labs (all labs ordered are listed, but only abnormal results are displayed) Labs Reviewed  CBC WITH DIFFERENTIAL/PLATELET  COMPREHENSIVE METABOLIC PANEL  TSH  HEMOGLOBIN A1C  POCT FASTING [facility] [place]    EKG   Radiology No results found.  Procedures Procedures (including critical care time)  Medications Ordered in UC Medications - No data to display  Initial Impression / Assessment and Plan / UC Course  I have reviewed the triage vital signs and the nursing notes.  Pertinent labs & imaging results that were available during my care of the patient were reviewed by me and considered in my medical decision making (see chart for details).  Dizziness  Vital signs are stable, patient is in no signs of distress, no abnormalities noted on neurological exam, unknown etiology of symptoms, CMP, CBC with differential, TSH, A1c pending, PCP referral placed for follow-up, point-of-care [facility] 116, recommended patient change positions slowly waiting 10 seconds between movements while symptoms are present, advised ensuring adequate fluid intake and rest for supportive care, may follow-up with this urgent care as needed Final Clinical Impressions(s) / UC Diagnoses   Final diagnoses:  Dizziness     Discharge Instructions      The cause of your dizziness is unknown at this time  Your blood sugar level in office today was  We have obtained lab work to check your electrolytes, liver, kidney, blood levels, thyroid and diabetes levels, you will be notified of any concerning values and told how to move forward  In the meantime ensure that you are getting adequate fluid intake, it is recommended that she drink at least eight glasses of water a day  Ensure that you are getting adequate rest, attempt to get at least 8 hours of sleep at night  Change positions slowly when symptoms are present waiting at least  10 seconds between movements to help stabilize her body  Keep a snack close by if this seems to help your symptoms, ideally peanut butter crackers are the best option due to the amount of protein  A primary care referral has been placed for you, if your symptoms continue to persist she will need follow-up and further evaluation   ED Prescriptions   None    I have reviewed the PDMP  during this encounter.   Hans Eden, NP 05/04/22 1531

## 2022-05-05 LAB — COMPREHENSIVE METABOLIC PANEL
ALT: 11 IU/L (ref 0–44)
AST: 17 IU/L (ref 0–40)
Albumin/Globulin Ratio: 2.2 (ref 1.2–2.2)
Albumin: 4.8 g/dL (ref 4.0–5.0)
Alkaline Phosphatase: 81 IU/L (ref 44–121)
BUN/Creatinine Ratio: 22 — ABNORMAL HIGH (ref 9–20)
BUN: 16 mg/dL (ref 6–20)
Bilirubin Total: 0.4 mg/dL (ref 0.0–1.2)
CO2: 27 mmol/L (ref 20–29)
Calcium: 9.6 mg/dL (ref 8.7–10.2)
Chloride: 101 mmol/L (ref 96–106)
Creatinine, Ser: 0.72 mg/dL — ABNORMAL LOW (ref 0.76–1.27)
Globulin, Total: 2.2 g/dL (ref 1.5–4.5)
Glucose: 105 mg/dL — ABNORMAL HIGH (ref 70–99)
Potassium: 4.3 mmol/L (ref 3.5–5.2)
Sodium: 140 mmol/L (ref 134–144)
Total Protein: 7 g/dL (ref 6.0–8.5)
eGFR: 125 mL/min/{1.73_m2} (ref >59)

## 2022-05-05 LAB — HEMOGLOBIN A1C
Est. average glucose Bld gHb Est-mCnc: 105 mg/dL
Hgb A1c MFr Bld: 5.3 % (ref 4.8–5.6)

## 2022-05-05 LAB — CBC WITH DIFFERENTIAL/PLATELET
Basophils Absolute: 0 10*3/uL (ref 0.0–0.2)
Basos: 1 %
EOS (ABSOLUTE): 0.1 10*3/uL (ref 0.0–0.4)
Eos: 3 %
Hematocrit: 44.2 % (ref 37.5–51.0)
Hemoglobin: 15.3 g/dL (ref 13.0–17.7)
Immature Grans (Abs): 0 10*3/uL (ref 0.0–0.1)
Immature Granulocytes: 1 %
Lymphocytes Absolute: 1.8 10*3/uL (ref 0.7–3.1)
Lymphs: 44 %
MCH: 32.5 pg (ref 26.6–33.0)
MCHC: 34.6 g/dL (ref 31.5–35.7)
MCV: 94 fL (ref 79–97)
Monocytes Absolute: 0.2 10*3/uL (ref 0.1–0.9)
Monocytes: 6 %
Neutrophils Absolute: 1.8 10*3/uL (ref 1.4–7.0)
Neutrophils: 45 %
Platelets: 215 10*3/uL (ref 150–450)
RBC: 4.71 x10E6/uL (ref 4.14–5.80)
RDW: 12.2 % (ref 11.6–15.4)
WBC: 4 10*3/uL (ref 3.4–10.8)

## 2022-05-05 LAB — TSH: TSH: 1.48 u[IU]/mL (ref 0.450–4.500)

## 2022-06-12 ENCOUNTER — Emergency Department: Payer: Self-pay

## 2022-06-12 ENCOUNTER — Other Ambulatory Visit: Payer: Self-pay

## 2022-06-12 ENCOUNTER — Emergency Department
Admission: EM | Admit: 2022-06-12 | Discharge: 2022-06-12 | Disposition: A | Discharge status: Home / Self Care | Payer: Self-pay | Attending: Emergency Medicine | Admitting: Emergency Medicine

## 2022-06-12 DIAGNOSIS — Y9351 Activity, roller skating (inline) and skateboarding: Secondary | ICD-10-CM | POA: Insufficient documentation

## 2022-06-12 DIAGNOSIS — S42022A Displaced fracture of shaft of left clavicle, initial encounter for closed fracture: Secondary | ICD-10-CM | POA: Insufficient documentation

## 2022-06-12 DIAGNOSIS — S0012XA Contusion of left eyelid and periocular area, initial encounter: Secondary | ICD-10-CM | POA: Insufficient documentation

## 2022-06-12 DIAGNOSIS — S0512XA Contusion of eyeball and orbital tissues, left eye, initial encounter: Secondary | ICD-10-CM

## 2022-06-12 DIAGNOSIS — S0240FA Zygomatic fracture, left side, initial encounter for closed fracture: Secondary | ICD-10-CM | POA: Insufficient documentation

## 2022-06-12 DIAGNOSIS — H1132 Conjunctival hemorrhage, left eye: Secondary | ICD-10-CM

## 2022-06-12 MED ORDER — CYCLOBENZAPRINE HCL 5 MG PO TABS: 5.0000 mg | ORAL_TABLET | Freq: Three times a day (TID) | ORAL | 0 refills | Status: AC | PRN

## 2022-06-12 NOTE — Discharge Instructions (Addendum)
Your exam x-rays and CT scans are overall reassuring but do confirm a fracture to the facial bone as well as a fracture to your clavicle.  Wear the Ace bandage as it is placed for comfort and support.  Follow-up with neurosurgery for further evaluation of your facial fractures.

## 2022-06-12 NOTE — ED Provider Triage Note (Signed)
Emergency Medicine Provider Triage Evaluation Note  Wayne Fisher , a 31 y.o. male  was evaluated in triage.  Pt complains of left eye injury and left shoulder pain. He was riding his skateboard down a hill in the rain and fell off. He hit his shoulder and left side of his face. Has had some blurry vision in the left eye, but seems to be getting better.  Physical Exam  There were no vitals taken for this visit. Gen:   Awake, no distress   Resp:  Normal effort  MSK:   Moves extremities without difficulty  Other:  Subconjunctival hematoma left eye noted. Pupil round and reactive.  Medical Decision Making  Medically screening exam initiated at 3:58 PM.  Appropriate orders placed.  Wayne Fisher was informed that the remainder of the evaluation will be completed by another provider, this initial triage assessment does not replace that evaluation, and the importance of remaining in the ED until their evaluation is complete.    Chinita Pester, FNP 06/12/22 1601

## 2022-06-12 NOTE — ED Notes (Signed)
Discharge instructions given by Paulino Door PA-C

## 2022-06-12 NOTE — ED Triage Notes (Addendum)
Pt in from home due to fall from skateboard; fall Saturday; pt's L eye swollen, blood noted in white of eye; pt's main complaint is L clavicle pain; pt denies LOC; reports mild blurred vision; reports eye is improving but L shoulder/clavicle is not. Pt A&Ox4. Pt limited in movement of L arm upwards due to pain.

## 2022-06-12 NOTE — ED Notes (Signed)
Patient declined discharge vital signs. 

## 2022-06-18 NOTE — ED Provider Notes (Signed)
[facility] Emergency Department Provider Note     Event Date/Time   First MD Initiated Contact with Patient 06/12/22 1732     (approximate)   History   Fall   HPI  Wayne Fisher is a 31 y.o. male presents himself to the ED for evaluation of what he reports was a skateboarding accident on Saturday. He presents with a black eye on the left with reports of ongoing left jaw pain as well left clavicle deformity. He notes pain with left arm ROM. He denies LOC, chest pain, or abdominal pain related to the accident. He denies headache, nausea, vomiting, or vision changes.This is his initial presentation for care related to his injuries.     Physical Exam   Triage Vital Signs: ED Triage Vitals  Enc Vitals Group     BP 06/12/22 1559 (!) 148/90     Pulse Rate 06/12/22 1559 91     Resp 06/12/22 1559 16     Temp 06/12/22 1559 98.2 F (36.8 C)     Temp Source 06/12/22 1559 Oral     SpO2 06/12/22 1559 96 %     Weight 06/12/22 1600 150 lb (68 kg)     Height 06/12/22 1600 5\' 8"  (1.727 m)     Head Circumference --      Peak Flow --      Pain Score 06/12/22 1600 7     Pain Loc --      Pain Edu? --      Excl. in GC? --     Most recent vital signs: Vitals:   06/12/22 1559  BP: (!) 148/90  Pulse: 91  Resp: 16  Temp: 98.2 F (36.8 C)  SpO2: 96%    General Awake, no distress. NAD HEENT NCAT. PERRL. EOMI. Left eye with a moderate lateral subconjunctival hemorrhage. Periorbital ecchymosis noted to the left eye. Tenderness to palp along the border of the left mandible and tenderness to palp over the left TMJ. No rhinorrhea. Mucous membranes are moist.  CV:  Good peripheral perfusion.  RESP:  Normal effort.  ABD:  No distention.  MSK:  Neck is supple without midline tenderness spasm, deformity, or step-off. Midshaft deformity to the left clavicle. Decreased left shoulder ROM due to subjective pain. Normal composite fist distally   ED Results /  Procedures / Treatments   Labs (all labs ordered are listed, but only abnormal results are displayed) Labs Reviewed - No data to display   EKG    RADIOLOGY  I personally viewed and evaluated these images as part of my medical decision making, as well as reviewing the written report by the radiologist.  ED Provider Interpretation: left clavicle fracture, left zygomatic fracture noted  CT Maxillofacial w/o CM  IMPRESSION: 1. Acute nondisplaced fracture through the frontal process of the left zygomatic bone with fracture line involving the lateral orbital wall and lesser sphenoid wing. The nondisplaced fracture line also involves the posteroinferior margin of the left frontal bone. No obvious signs of acute intracranial hemorrhage on limited views. Given the fracture line involvement of the calvarium, a dedicated CT of the brain is recommended. 2. Negative for intraorbital hematoma.   These results were called by telephone at the time of interpretation on 06/12/2022 at 7:04 pm to provider Dr 06/14/2022, who verbally acknowledged these results.   CT Head w/o CM  IMPRESSION: No acute intracranial pathology.  DG Left Shoulder  IMPRESSION: Comminuted, angulated fracture is seen in  the shaft of left clavicle.    PROCEDURES:  Critical Care performed: No  Procedures   MEDICATIONS ORDERED IN ED: Medications - No data to display   IMPRESSION / MDM / ASSESSMENT AND PLAN / ED COURSE  I reviewed the triage vital signs and the nursing notes.                              Differential diagnosis includes, but is not limited to, SDH, cranial fracture, facial/orbital fractures, shoulder dislocation, clavicle fracture  Patient's presentation is most consistent with acute presentation with potential threat to life or bodily function.  S/W Dr. Emogene Morgan (neuroSx): reviewed the case and CT reports with him. He suggests the patient is stable for outpatient follow-up given stable  fractures and no closed head injury.   Patient's diagnosis is consistent with Closed zygomatic fracture, left clavicle fracture, sub-conj hemorrhage due to skateboarding accident. Patient will be discharged home with prescriptions for cyclobenzaprine. Patient is to follow up with Neurosurgery as needed or otherwise directed. Patient is given ED precautions to return to the ED for any worsening or new symptoms.     FINAL CLINICAL IMPRESSION(S) / ED DIAGNOSES   Final diagnoses:  Closed displaced fracture of shaft of left clavicle, initial encounter  Eye contusion, left, initial encounter  Subconjunctival hemorrhage of left eye  Closed fracture of left zygomatic arch, initial encounter (HCC)     Rx / DC Orders   ED Discharge Orders          Ordered    cyclobenzaprine (FLEXERIL) 5 MG tablet  3 times daily PRN        06/12/22 2040             Note:  This document was prepared using Dragon voice recognition software and may include unintentional dictation errors.    Lissa Hoard, PA-C 06/18/22 1354    Sharyn Creamer, MD 06/19/22 2214

## 2022-07-06 IMAGING — DX DG HAND COMPLETE 3+V*R*
3 series · 3 of 3 positions shown · non-contrast
Comparison: None.

CLINICAL DATA: Right thumb burn with possible infection, initial
encounter

EXAM:
RIGHT HAND - COMPLETE 3+ VIEW

[hand ap]
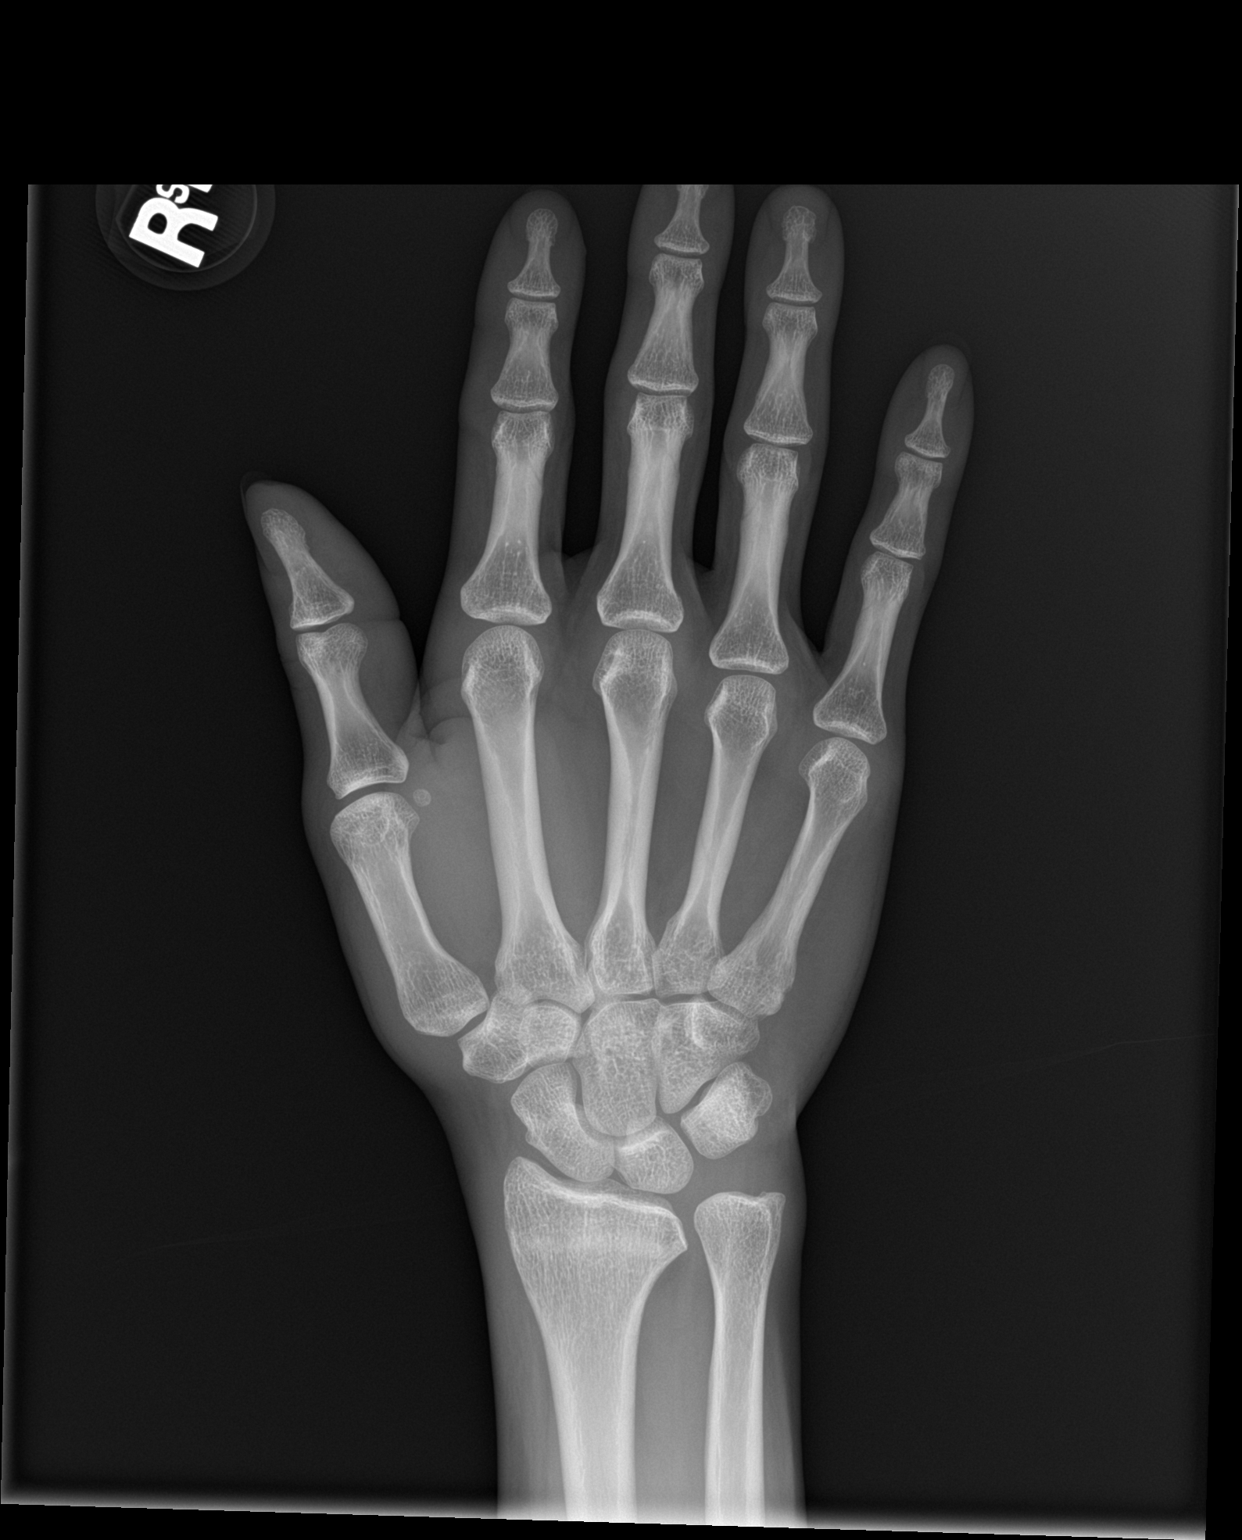

[hand obl]
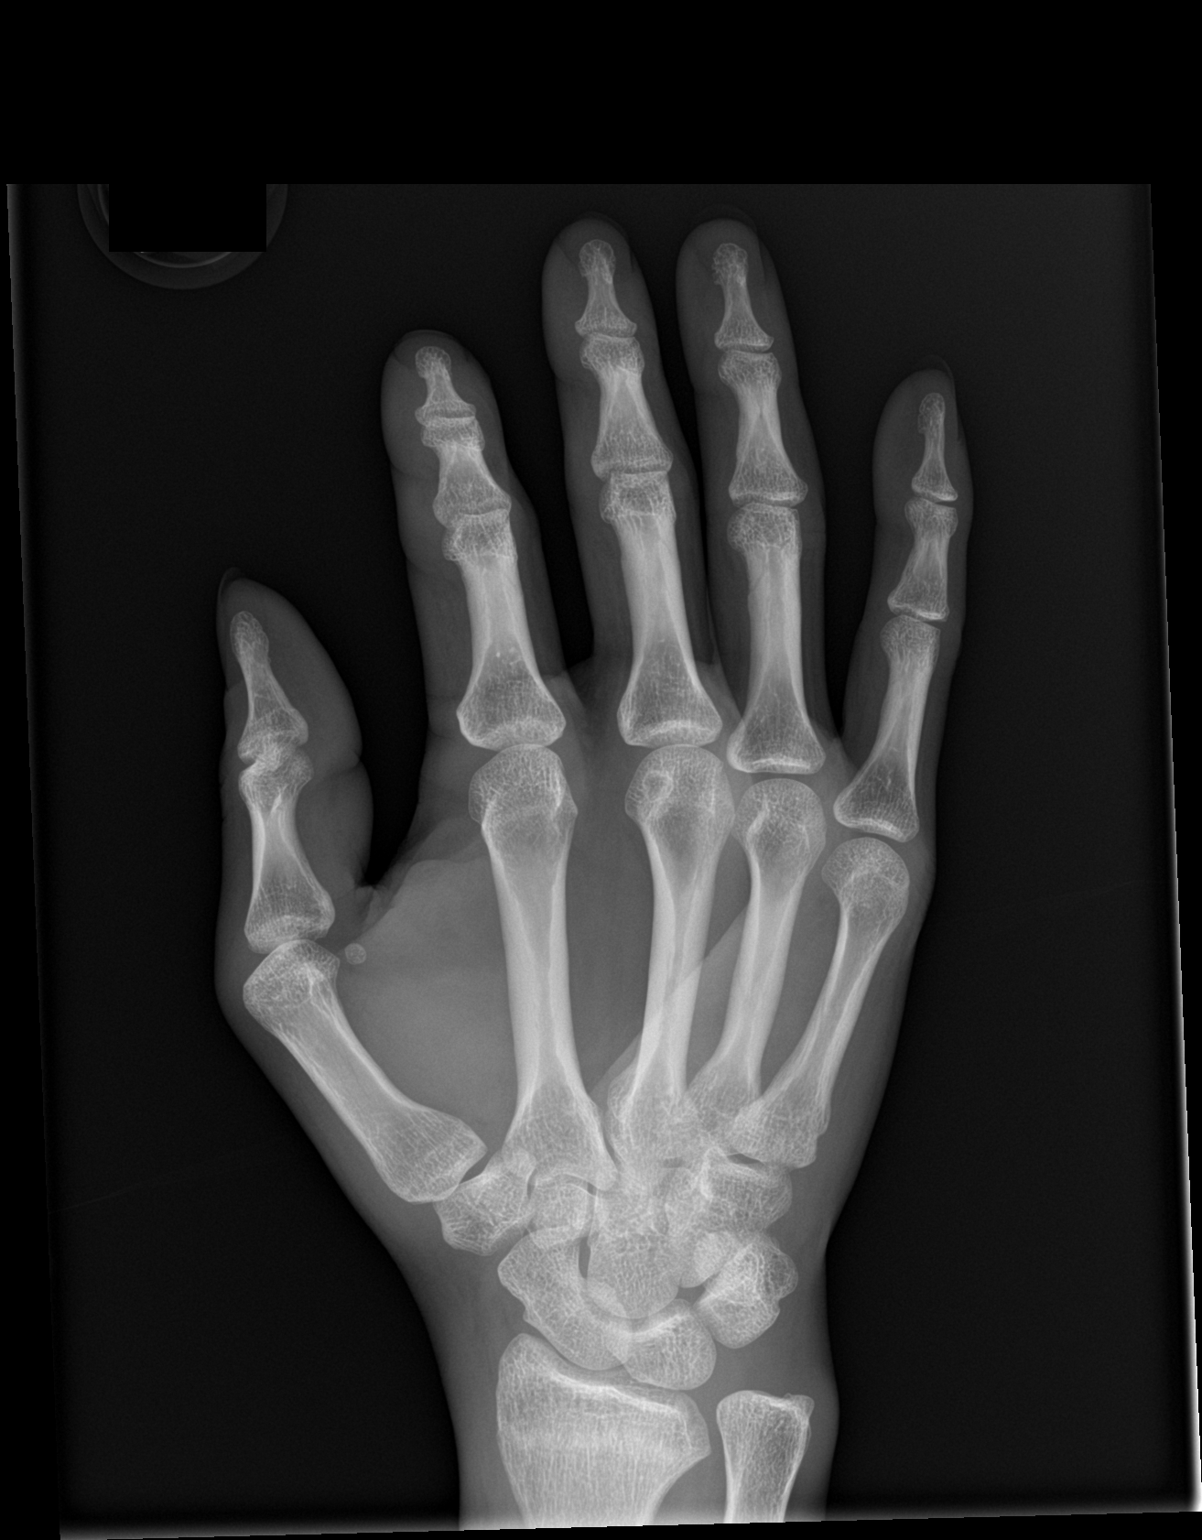

[hand lat]
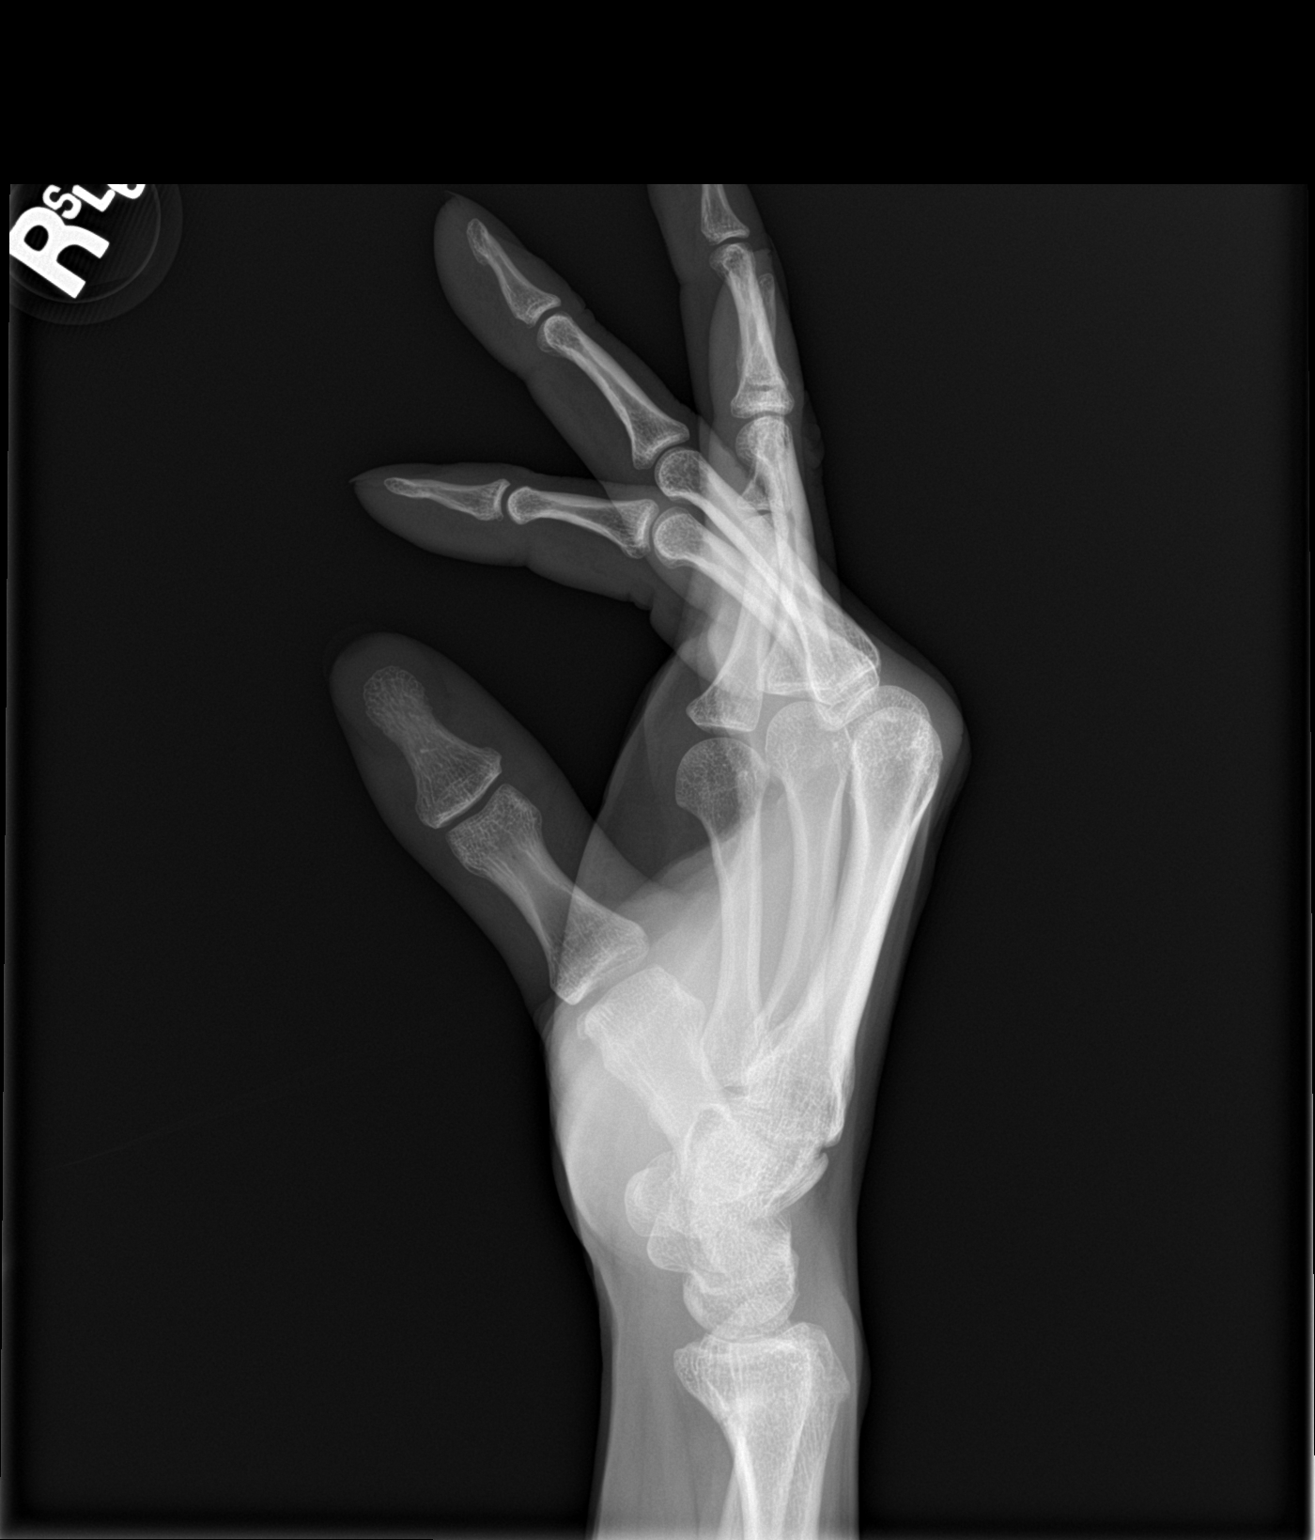

[3 of 3 positions shown; findings below may reference images not displayed]

FINDINGS: There is no evidence of fracture or dislocation. There is no
evidence of arthropathy or other focal bone abnormality. Soft
tissues are unremarkable.
IMPRESSION: No acute abnormality noted.

## 2022-09-01 ENCOUNTER — Ambulatory Visit: Admit: 2022-09-01 | Payer: Self-pay | Source: Home / Self Care

## 2022-09-01 ENCOUNTER — Ambulatory Visit
Admission: EM | Admit: 2022-09-01 | Discharge: 2022-09-01 | Disposition: A | Discharge status: Home / Self Care | Payer: Self-pay | Attending: Emergency Medicine | Admitting: Emergency Medicine

## 2022-09-01 DIAGNOSIS — N5089 Other specified disorders of the male genital organs: Secondary | ICD-10-CM

## 2022-09-01 DIAGNOSIS — L03113 Cellulitis of right upper limb: Secondary | ICD-10-CM

## 2022-09-01 MED ORDER — DOXYCYCLINE HYCLATE 100 MG PO CAPS: 100.0000 mg | ORAL_CAPSULE | Freq: Two times a day (BID) | ORAL | 0 refills | Status: AC

## 2022-09-01 MED ORDER — CLOTRIMAZOLE-BETAMETHASONE 1-0.05 % EX CREA: TOPICAL_CREAM | CUTANEOUS | 0 refills | Status: AC

## 2022-09-01 NOTE — ED Provider Notes (Signed)
[facility]    CSN: [id number] Arrival date & time: 09/01/22  1407      History   Chief Complaint Chief Complaint  Patient presents with   Blister   Rash    HPI ALIJA RIANO II is a 31 y.o. male.   HPI  31 year old male here for evaluation of multiple complaints.  Patient's first complaint is that he developed a blister on his right thumb 2 days ago that is red and painful.  He states he cleaned the area with an alcohol swab but then poked multiple times with his mom sterile diabetic needles and drained some pus.  He states there is no pus draining now but now he is got red streaks going up his forearm and upper arm.  He has not had a fever.  He states he had this in the past and he was prescribed antibiotics for it.  Patient second complaint is that he has been experiencing itchy dry skin and flaky skin on his scrotum.  This been an ongoing issue.  History reviewed. No pertinent past medical history.  There are no problems to display for this patient.   History reviewed. No pertinent surgical history.     Home Medications    Prior to Admission medications   Medication Sig Start Date End Date Taking? Authorizing Provider  clotrimazole-betamethasone (LOTRISONE) cream Apply to affected area 2 times daily prn 09/01/22  Yes Becky Augusta, NP  cyclobenzaprine (FLEXERIL) 5 MG tablet Take 1 tablet (5 mg total) by mouth 3 (three) times daily as needed. 06/12/22  Yes Menshew, Charlesetta Ivory, PA-C  doxycycline (VIBRAMYCIN) 100 MG capsule Take 1 capsule (100 mg total) by mouth 2 (two) times daily. 09/01/22  Yes Becky Augusta, NP  fluticasone (FLONASE) 50 MCG/ACT nasal spray Place 1 spray into both nostrils daily for 15 days. 12/11/18 12/26/18  Dionne Bucy, MD    Family History History reviewed. No pertinent family history.  Social History Social History   Tobacco Use   Smoking status: Former    Types: Cigarettes   Smokeless tobacco: Never  Vaping Use    Vaping Use: Never used  Substance Use Topics   Alcohol use: Yes    Comment: occasionally   Drug use: Yes    Types: Marijuana     Allergies   Patient has no known allergies.   Review of Systems Review of Systems  Constitutional:  Negative for fever.  Skin:  Positive for color change, rash and wound.  Hematological: Negative.   Psychiatric/Behavioral: Negative.       Physical Exam Triage Vital Signs ED Triage Vitals  Enc Vitals Group     BP 09/01/22 1424 (!) 126/109     Pulse Rate 09/01/22 1424 (!) 110     Resp 09/01/22 1424 18     Temp 09/01/22 1424 98.2 F (36.8 C)     Temp Source 09/01/22 1424 Oral     SpO2 09/01/22 1424 99 %     Weight 09/01/22 1422 155 lb (70.3 kg)     Height 09/01/22 1422 5\' 10"  (1.778 m)     Head Circumference --      Peak Flow --      Pain Score 09/01/22 1421 8     Pain Loc --      Pain Edu? --      Excl. in GC? --    No data found.  Updated Vital Signs BP (!) 126/109 (BP Location: Left Arm)  Pulse (!) 110   Temp 98.2 F (36.8 C) (Oral)   Resp 18   Ht 5\' 10"  (1.778 m)   Wt 155 lb (70.3 kg)   SpO2 99%   BMI 22.24 kg/m   Visual Acuity Right Eye Distance:   Left Eye Distance:   Bilateral Distance:    Right Eye Near:   Left Eye Near:    Bilateral Near:     Physical Exam Vitals and nursing note reviewed.  Constitutional:      Appearance: Normal appearance. He is not ill-appearing.  HENT:     Head: Normocephalic and atraumatic.  Skin:    General: Skin is warm and dry.     Capillary Refill: Capillary refill takes less than 2 seconds.     Findings: Erythema and rash present.  Neurological:     General: No focal deficit present.     Mental Status: He is alert and oriented to person, place, and time.  Psychiatric:        Mood and Affect: Mood normal.        Behavior: Behavior normal.        Thought Content: Thought content normal.        Judgment: Judgment normal.      UC Treatments / Results  Labs (all labs  ordered are listed, but only abnormal results are displayed) Labs Reviewed - No data to display  EKG   Radiology No results found.  Procedures Procedures (including critical care time)  Medications Ordered in UC Medications - No data to display  Initial Impression / Assessment and Plan / UC Course  I have reviewed the triage vital signs and the nursing notes.  Pertinent labs & imaging results that were available during my care of the patient were reviewed by me and considered in my medical decision making (see chart for details).   Patient is a nontoxic-appearing 31 year old male here for evaluation of mental complaints outlined HPI above.  His first complaint is red, painful blister on his right thumb that developed 2 days ago that he punctured at home with sterile diabetic needles.  He did drain pus and now has red streaks ascending his arm and lymphadenopathy in his right axilla.  He does not have any fever.  The blister on his thumb is erythematous and edematous but there is no fluctuance or induration.  There is definite concern for cellulitis.      I will place patient on doxycycline twice daily for 10 days for treatment of cellulitis and I have advised him that if the red streaks become more prominent, he develops more swollen nodes in his right axilla, or he develops fever that he has to go to the ER for evaluation.  He verbalized understanding of same.  Patient second complaint is itchy dry skin on his scrotum that has been going on for an unknown period of time.  He has been using jock itch cream without any improvement of his symptoms.  On exam patient has mild erythema of his scrotum but no edema, fluctuance, or induration.  He does have large chunks of flaky skin that is restricted to just his scrotum.  No lesions or flaky skin on the shaft of the penis on either inner thigh.  This does not look like typical tinea cruris.  Given the dry flaky skin and erythema I am concerned  that it is fungal however.  I will treat the patient with Lotrisone, apply twice daily until the rash is resolved.  If it does not resolve in 2 to 3 weeks he needs to follow-up with dermatology.    Final Clinical Impressions(s) / UC Diagnoses   Final diagnoses:  Cellulitis of right upper extremity  Scrotal irritation     Discharge Instructions      Take the Doxycycline twice daily with food for 10 days.  Doxycycline will make you more sensitive to sunburn so wear sunscreen when outdoors and reapply it every 90 minutes.  Apply warm compresses to help promote drainage.  Use OTC Tylenol and Ibuprofen according to the package instructions as needed for pain.  If you develop worsening red streaks going up your arm, more fullness in your arm pit, or fever you need to go to the ER.  Apply the Lotrisone cream to your scrotum twice daily until the rash resolves.    If the rash does not resolve you need to follow-up with dermatology.     ED Prescriptions     Medication Sig Dispense Auth. Provider   doxycycline (VIBRAMYCIN) 100 MG capsule Take 1 capsule (100 mg total) by mouth 2 (two) times daily. 20 capsule Becky Augusta, NP   clotrimazole-betamethasone (LOTRISONE) cream Apply to affected area 2 times daily prn 15 g Becky Augusta, NP      PDMP not reviewed this encounter.   Becky Augusta, NP 09/01/22 1501

## 2022-09-01 NOTE — ED Triage Notes (Addendum)
Pt c/o blister on right thumb and rash along right arm x1day.  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   Pt states that the rash and blister has been reoccurring for 3 years.   Pt states that this has happened before and has been given antibiotics.   Pt thumb is swollen and purple and has small scabs along the blister. Pt states that he uses his moms diabetic supplies to prick and drain the blister and had drained it before coming in today.  Pt states that he recently fell and cracked his skull and collarbone. Pt fell and was seen by the hospital 04/2022

## 2022-09-01 NOTE — Discharge Instructions (Signed)
Take the Doxycycline twice daily with food for 10 days.  Doxycycline will make you more sensitive to sunburn so wear sunscreen when outdoors and reapply it every 90 minutes.  Apply warm compresses to help promote drainage.  Use OTC Tylenol and Ibuprofen according to the package instructions as needed for pain.  If you develop worsening red streaks going up your arm, more fullness in your arm pit, or fever you need to go to the ER.  Apply the Lotrisone cream to your scrotum twice daily until the rash resolves.    If the rash does not resolve you need to follow-up with dermatology.
